# Patient Record
Sex: Male | Born: 1955 | Race: Black or African American | Hispanic: No | Smoking: Never smoker
Health system: Southern US, Community
[De-identification: ages and names within clinical notes are randomized; demographics above are authoritative.]

## PROBLEM LIST (undated history)

## (undated) HISTORY — PX: NO PAST SURGERIES: SHX2092

---

## 2012-01-03 DIAGNOSIS — J309 Allergic rhinitis, unspecified: Secondary | ICD-10-CM | POA: Insufficient documentation

## 2012-01-03 DIAGNOSIS — Z0000 Encounter for general adult medical examination without abnormal findings: Secondary | ICD-10-CM | POA: Insufficient documentation

## 2013-02-28 DIAGNOSIS — Z86010 Personal history of colonic polyps: Secondary | ICD-10-CM | POA: Insufficient documentation

## 2013-12-05 DIAGNOSIS — G4733 Obstructive sleep apnea (adult) (pediatric): Secondary | ICD-10-CM | POA: Insufficient documentation

## 2018-07-20 ENCOUNTER — Ambulatory Visit
Admission: EM | Admit: 2018-07-20 | Discharge: 2018-07-20 | Disposition: A | Discharge status: Home / Self Care | Payer: BLUE CROSS/BLUE SHIELD

## 2018-07-20 ENCOUNTER — Encounter: Payer: Self-pay | Admitting: Emergency Medicine

## 2018-07-20 ENCOUNTER — Other Ambulatory Visit: Payer: Self-pay

## 2018-07-20 DIAGNOSIS — J069 Acute upper respiratory infection, unspecified: Secondary | ICD-10-CM | POA: Diagnosis not present

## 2018-07-20 DIAGNOSIS — B9789 Other viral agents as the cause of diseases classified elsewhere: Secondary | ICD-10-CM

## 2018-07-20 DIAGNOSIS — J0110 Acute frontal sinusitis, unspecified: Secondary | ICD-10-CM

## 2018-07-20 MED ORDER — DOXYCYCLINE HYCLATE 100 MG PO CAPS: 100.0000 mg | ORAL_CAPSULE | Freq: Two times a day (BID) | ORAL | 0 refills | Status: DC

## 2018-07-20 NOTE — ED Provider Notes (Signed)
[facility] ____________________________________________  Time seen: Approximately 12:17 PM  I have reviewed the triage vital signs and the nursing notes.   HISTORY  Chief Complaint Cough   HPI Stephen Forbes is a 62 y.o. male presenting for evaluation of 1 week of runny nose, nasal congestion, cough.  States initially had a sore throat but states that since resolved.  Also states that the cough has started to improve but continues still with a lot of nasal congestion and sinus pressure.  States minimal pain currently to sinuses.  No accompanying chest pain or shortness of breath.  Denies known fevers.  States he got sick a little over week ago after visiting family at Thanksgiving.  Continues to eat and drink well.  Did try some over-the-counter NyQuil and congestion medication that help but no resolution.  Denies other aggravating alleviating factors.  Worse otherwise doing well.  Denies other complaints.  Denies recent antibiotic use.  PCP: Graciela HusbandsKlein   History reviewed. No pertinent past medical history. Denies   There are no active problems to display for this patient.   History reviewed. No pertinent surgical history.   No current facility-administered medications for this encounter.   Current Outpatient Medications:  .  aspirin 81 MG chewable tablet, Chew by mouth., Disp: , Rfl:  .  Multiple Vitamins-Minerals (MULTIVITAL) tablet, Take by mouth., Disp: , Rfl:  .  doxycycline (VIBRAMYCIN) 100 MG capsule, Take 1 capsule (100 mg total) by mouth 2 (two) times daily., Disp: 20 capsule, Rfl: 0  Allergies Patient has no known allergies.  History reviewed. No pertinent family history.  Social History Social History   Tobacco Use  . Smoking status: Never Smoker  . Smokeless tobacco: Never Used  Substance Use Topics  . Alcohol use: Never    Frequency: Never  . Drug use: Never    Review of Systems Constitutional: No fever ENT: as above. Cardiovascular: Denies  chest pain. Respiratory: Denies shortness of breath. Gastrointestinal: No abdominal pain. Musculoskeletal: Negative for back pain. Skin: Negative for rash.  ____________________________________________   PHYSICAL EXAM:  VITAL SIGNS: ED Triage Vitals  Enc Vitals Group     BP 07/20/18 1143 (!) 155/100     Pulse Rate 07/20/18 1143 75     Resp 07/20/18 1143 16     Temp 07/20/18 1143 98.4 F (36.9 C)     Temp Source 07/20/18 1143 Oral     SpO2 07/20/18 1143 99 %     Weight 07/20/18 1141 275 lb (124.7 kg)     Height 07/20/18 1141 6' (1.829 m)     Head Circumference --      Peak Flow --      Pain Score 07/20/18 1141 0     Pain Loc --      Pain Edu? --      Excl. in GC? --    Vitals:   07/20/18 1141 07/20/18 1143 07/20/18 1210  BP:  (!) 155/100 138/90  Pulse:  75   Resp:  16   Temp:  98.4 F (36.9 C)   TempSrc:  Oral   SpO2:  99%   Weight: 275 lb (124.7 kg)    Height: 6' (1.829 m)      Constitutional: Alert and oriented. Well appearing and in no acute distress. Eyes: Conjunctivae are normal.  Head: Atraumatic.  Mild bilateral frontal sinus tenderness palpation.  No maxillary sinus tenderness.  No swelling. No erythema.  Ears: no erythema, normal TMs bilaterally.   Nose:Nasal congestion  Mouth/Throat: Mucous membranes are moist. No pharyngeal erythema. No tonsillar swelling or exudate.  Neck: No stridor.  No cervical spine tenderness to palpation. Hematological/Lymphatic/Immunilogical: No cervical lymphadenopathy. Cardiovascular: Normal rate, regular rhythm. Grossly normal heart sounds.  Good peripheral circulation. Respiratory: Normal respiratory effort.  No retractions. No wheezes, rales or rhonchi. Good air movement.  Musculoskeletal: Ambulatory with steady gait.  Neurologic:  Normal speech and language. No gait instability. Skin:  Skin appears warm, dry and intact. No rash noted. Psychiatric: Mood and affect are normal. Speech and behavior are  normal.  ___________________________________________   LABS (all labs ordered are listed, but only abnormal results are displayed)  Labs Reviewed - No data to display   PROCEDURES Procedures    INITIAL IMPRESSION / ASSESSMENT AND PLAN / ED COURSE  Pertinent labs & imaging results that were available during my care of the patient were reviewed by me and considered in my medical decision making (see chart for details).  Well-appearing patient.  No acute distress.  Suspect recent viral upper respiratory infection.  Concern for secondary sinusitis.  Will treat with oral doxycycline.  Encourage rest, fluids, supportive care.Discussed indication, risks and benefits of medications with patient.  Discussed follow up with Primary care physician this week as needed. Discussed follow up and return parameters including no resolution or any worsening concerns. Patient verbalized understanding and agreed to plan.   ____________________________________________   FINAL CLINICAL IMPRESSION(S) / ED DIAGNOSES  Final diagnoses:  Viral URI with cough  Acute frontal sinusitis, recurrence not specified     ED Discharge Orders         Ordered    doxycycline (VIBRAMYCIN) 100 MG capsule  2 times daily     07/20/18 1206           Note: This dictation was prepared with Dragon dictation along with smaller phrase technology. Any transcriptional errors that result from this process are unintentional.         Renford Dills, NP 07/20/18 1220

## 2018-07-20 NOTE — ED Triage Notes (Signed)
Patient c/o cough, congestion and nasal congestion that started a week ago.

## 2018-07-20 NOTE — Discharge Instructions (Addendum)
Take medication as prescribed. Rest. Drink plenty of fluids.  ° °Follow up with your primary care physician this week as needed. Return to Urgent care for new or worsening concerns.  ° °

## 2019-05-10 ENCOUNTER — Other Ambulatory Visit: Payer: Self-pay

## 2019-05-10 ENCOUNTER — Ambulatory Visit
Admission: EM | Admit: 2019-05-10 | Discharge: 2019-05-10 | Disposition: A | Discharge status: Home / Self Care | Payer: BC Managed Care – PPO | Attending: Family Medicine | Admitting: Family Medicine

## 2019-05-10 ENCOUNTER — Encounter: Payer: Self-pay | Admitting: Emergency Medicine

## 2019-05-10 DIAGNOSIS — L03115 Cellulitis of right lower limb: Secondary | ICD-10-CM

## 2019-05-10 MED ORDER — CEPHALEXIN 500 MG PO CAPS: 500.0000 mg | ORAL_CAPSULE | Freq: Four times a day (QID) | ORAL | 0 refills | Status: DC

## 2019-05-10 MED ORDER — CEFAZOLIN SODIUM 1 G IJ SOLR
1.0000 g | Freq: Once | INTRAMUSCULAR | Status: AC
  Administered 2019-05-10: 14:00:00 1 g via INTRAMUSCULAR

## 2019-05-10 NOTE — ED Triage Notes (Signed)
Patient states that he fell and twisted his right knee a week ago.  Patient states that the pain has gone away but still has swelling in his right and in his right lower leg.

## 2019-05-10 NOTE — Discharge Instructions (Signed)
Warm compresses to area and elevate leg as much as possible Follow up with Primary Care Provider next week as scheduled Go to Emergency Department if symptoms are worsening

## 2019-05-10 NOTE — ED Provider Notes (Signed)
[facility]    CSN: [id number] Arrival date & time: 05/10/19  1318      History   Chief Complaint Chief Complaint  Patient presents with  . Fall  . knee swelling    right    HPI Stephen Forbes is a 63 y.o. male.   64 yo male with a c/o right knee and lower leg swelling for the past week after twisting, falling and scraping his knee. States he didn't fall hard on the knee but did scrape the skin. Has been able to walk/bear weight. Denies any fevers or drainage.   Fall    History reviewed. No pertinent past medical history.  There are no active problems to display for this patient.   History reviewed. No pertinent surgical history.     Home Medications    Prior to Admission medications   Medication Sig Start Date End Date Taking? Authorizing Provider  aspirin 81 MG chewable tablet Chew by mouth.   Yes [provider]  Cholecalciferol (VITAMIN D-1000 MAX ST) 25 MCG (1000 UT) tablet Take by mouth.   Yes [provider]  Multiple Vitamins-Minerals (MULTIVITAL) tablet Take by mouth.   Yes [provider]  cephALEXin (KEFLEX) 500 MG capsule Take 1 capsule (500 mg total) by mouth 4 (four) times daily. 05/10/19   Payton Mccallum, MD  doxycycline (VIBRAMYCIN) 100 MG capsule Take 1 capsule (100 mg total) by mouth 2 (two) times daily. 07/20/18   Renford Dills, NP    Family History History reviewed. No pertinent family history.  Social History Social History   Tobacco Use  . Smoking status: Never Smoker  . Smokeless tobacco: Never Used  Substance Use Topics  . Alcohol use: Never    Frequency: Never  . Drug use: Never     Allergies   Patient has no known allergies.   Review of Systems Review of Systems   Physical Exam Triage Vital Signs ED Triage Vitals  Enc Vitals Group     BP 05/10/19 1333 (!) 138/94     Pulse Rate 05/10/19 1333 66     Resp 05/10/19 1333 16     Temp 05/10/19 1333 98.2 F (36.8 C)     Temp  Source 05/10/19 1333 Oral     SpO2 05/10/19 1333 100 %     Weight 05/10/19 1330 285 lb (129.3 kg)     Height 05/10/19 1330 6' (1.829 m)     Head Circumference --      Peak Flow --      Pain Score 05/10/19 1330 0     Pain Loc --      Pain Edu? --      Excl. in GC? --    No data found.  Updated Vital Signs BP (!) 138/94 (BP Location: Left Arm)   Pulse 66   Temp 98.2 F (36.8 C) (Oral)   Resp 16   Ht 6' (1.829 m)   Wt 129.3 kg   SpO2 100%   BMI 38.65 kg/m   Visual Acuity Right Eye Distance:   Left Eye Distance:   Bilateral Distance:    Right Eye Near:   Left Eye Near:    Bilateral Near:     Physical Exam Vitals signs and nursing note reviewed.  Constitutional:      General: He is not in acute distress.    Appearance: He is not toxic-appearing or diaphoretic.  Musculoskeletal:     Right lower leg: Edema (right knee and  shin with edema, blanchable erythema, and warmth; extremity neurovascularly intact) present.  Neurological:     Mental Status: He is alert.      UC Treatments / Results  Labs (all labs ordered are listed, but only abnormal results are displayed) Labs Reviewed - No data to display  EKG   Radiology No results found.  Procedures Procedures (including critical care time)  Medications Ordered in UC Medications  ceFAZolin (ANCEF) injection 1 g (1 g Intramuscular Given 05/10/19 1404)    Initial Impression / Assessment and Plan / UC Course  I have reviewed the triage vital signs and the nursing notes.  Pertinent labs & imaging results that were available during my care of the patient were reviewed by me and considered in my medical decision making (see chart for details).      Final Clinical Impressions(s) / UC Diagnoses   Final diagnoses:  Cellulitis of leg, right     Discharge Instructions     Warm compresses to area and elevate leg as much as possible Follow up with Primary Care Provider next week as scheduled Go to Emergency  Department if symptoms are worsening    ED Prescriptions    Medication Sig Dispense Auth. Provider   cephALEXin (KEFLEX) 500 MG capsule Take 1 capsule (500 mg total) by mouth 4 (four) times daily. 14 capsule Norval Gable, MD      1. diagnosis reviewed with patient 2. Given Ancef 1gm IM x 1 3. rx as per orders above; reviewed possible side effects, interactions, risks and benefits  4. Recommend supportive treatment as above 5. Follow-up prn  PDMP not reviewed this encounter.   Norval Gable, MD 05/10/19 [phone]

## 2019-12-06 ENCOUNTER — Ambulatory Visit
Admission: EM | Admit: 2019-12-06 | Discharge: 2019-12-06 | Disposition: A | Discharge status: Home / Self Care | Payer: BC Managed Care – PPO | Attending: Family Medicine | Admitting: Family Medicine

## 2019-12-06 ENCOUNTER — Encounter: Payer: Self-pay | Admitting: Emergency Medicine

## 2019-12-06 ENCOUNTER — Other Ambulatory Visit: Payer: Self-pay

## 2019-12-06 DIAGNOSIS — J0100 Acute maxillary sinusitis, unspecified: Secondary | ICD-10-CM | POA: Diagnosis not present

## 2019-12-06 DIAGNOSIS — Z7982 Long term (current) use of aspirin: Secondary | ICD-10-CM | POA: Diagnosis not present

## 2019-12-06 DIAGNOSIS — J40 Bronchitis, not specified as acute or chronic: Secondary | ICD-10-CM | POA: Diagnosis not present

## 2019-12-06 DIAGNOSIS — R05 Cough: Secondary | ICD-10-CM | POA: Diagnosis present

## 2019-12-06 DIAGNOSIS — Z20822 Contact with and (suspected) exposure to covid-19: Secondary | ICD-10-CM | POA: Diagnosis not present

## 2019-12-06 LAB — SARS CORONAVIRUS 2 (TAT 6-24 HRS): SARS Coronavirus 2: NEGATIVE

## 2019-12-06 MED ORDER — AZITHROMYCIN 250 MG PO TABS: ORAL_TABLET | ORAL | 0 refills | Status: DC

## 2019-12-06 NOTE — ED Triage Notes (Signed)
Patient states that he first was stuffy and runny nose.  Patient states that he had a cough and congestion that started since Monday.  Patient denies fevers.

## 2019-12-06 NOTE — ED Provider Notes (Signed)
[facility]    CSN: [id number] Arrival date & time: 12/06/19  1227      History   Chief Complaint Chief Complaint  Patient presents with  . Cough    HPI Stephen Forbes is a 64 y.o. male.   64 yo male with a c/o nasal congestion and cough for the past 7 days. Also c/o sinus pressure, headache. Cough is productive. Denies any fevers, chills, chest pains, or shortness of breath.    Cough   History reviewed. No pertinent past medical history.  There are no problems to display for this patient.   History reviewed. No pertinent surgical history.     Home Medications    Prior to Admission medications   Medication Sig Start Date End Date Taking? Authorizing Provider  aspirin 81 MG chewable tablet Chew by mouth.   Yes [provider]  Cholecalciferol (VITAMIN D-1000 MAX ST) 25 MCG (1000 UT) tablet Take by mouth.   Yes [provider]  Multiple Vitamins-Minerals (MULTIVITAL) tablet Take by mouth.   Yes [provider]  azithromycin (ZITHROMAX Z-PAK) 250 MG tablet Take 2 tabs po once day 1, then 1 tab po qd for next 4 days 12/06/19   Norval Gable, MD  cephALEXin (KEFLEX) 500 MG capsule Take 1 capsule (500 mg total) by mouth 4 (four) times daily. 05/10/19   Norval Gable, MD  doxycycline (VIBRAMYCIN) 100 MG capsule Take 1 capsule (100 mg total) by mouth 2 (two) times daily. 07/20/18   Marylene Land, NP    Family History History reviewed. No pertinent family history.  Social History Social History   Tobacco Use  . Smoking status: Never Smoker  . Smokeless tobacco: Never Used  Substance Use Topics  . Alcohol use: Never  . Drug use: Never     Allergies   Patient has no known allergies.   Review of Systems Review of Systems  Respiratory: Positive for cough.      Physical Exam Triage Vital Signs ED Triage Vitals  Enc Vitals Group     BP 12/06/19 1242 (!) 153/104     Pulse Rate 12/06/19 1242 69     Resp 12/06/19 1242  16     Temp 12/06/19 1242 98.5 F (36.9 C)     Temp Source 12/06/19 1242 Oral     SpO2 12/06/19 1242 99 %     Weight 12/06/19 1237 285 lb (129.3 kg)     Height 12/06/19 1237 6' (1.829 m)     Head Circumference --      Peak Flow --      Pain Score 12/06/19 1237 0     Pain Loc --      Pain Edu? --      Excl. in [place]? --    No data found.  Updated Vital Signs BP (!) 153/104 (BP Location: Left Arm)   Pulse 69   Temp 98.5 F (36.9 C) (Oral)   Resp 16   Ht 6' (1.829 m)   Wt 129.3 kg   SpO2 99%   BMI 38.65 kg/m   Visual Acuity Right Eye Distance:   Left Eye Distance:   Bilateral Distance:    Right Eye Near:   Left Eye Near:    Bilateral Near:     Physical Exam Vitals and nursing note reviewed.  Constitutional:      General: He is not in acute distress.    Appearance: He is not toxic-appearing or diaphoretic.  HENT:  Right Ear: Tympanic membrane normal.     Left Ear: Tympanic membrane normal.  Cardiovascular:     Rate and Rhythm: Normal rate.     Heart sounds: Normal heart sounds.  Pulmonary:     Effort: Pulmonary effort is normal. No respiratory distress.     Breath sounds: No stridor. Wheezing (few) and rhonchi (few) present. No rales.  Neurological:     Mental Status: He is alert.      UC Treatments / Results  Labs (all labs ordered are listed, but only abnormal results are displayed) Labs Reviewed  SARS CORONAVIRUS 2 (TAT 6-24 HRS)    EKG   Radiology No results found.  Procedures Procedures (including critical care time)  Medications Ordered in UC Medications - No data to display  Initial Impression / Assessment and Plan / UC Course  I have reviewed the triage vital signs and the nursing notes.  Pertinent labs & imaging results that were available during my care of the patient were reviewed by me and considered in my medical decision making (see chart for details).      Final Clinical Impressions(s) / UC Diagnoses   Final diagnoses:    Bronchitis  Acute maxillary sinusitis, recurrence not specified     Discharge Instructions     Continue flonase, over the counter medication as needed    ED Prescriptions    Medication Sig Dispense Auth. Provider   azithromycin (ZITHROMAX Z-PAK) 250 MG tablet Take 2 tabs po once day 1, then 1 tab po qd for next 4 days 6 each Payton Mccallum, MD     1. diagnosis reviewed with patient 2. rx as per orders above; reviewed possible side effects, interactions, risks and benefits  3. Recommend supportive treatment as above 4. Follow-up prn if symptoms worsen or don't improve   PDMP not reviewed this encounter.   Payton Mccallum, MD 12/06/19 1308

## 2019-12-06 NOTE — Discharge Instructions (Signed)
Continue flonase, over the counter medication as needed

## 2020-05-23 ENCOUNTER — Ambulatory Visit
Admission: RE | Admit: 2020-05-23 | Discharge: 2020-05-23 | Disposition: A | Discharge status: Home / Self Care | Payer: BC Managed Care – PPO | Source: Ambulatory Visit | Attending: Family Medicine | Admitting: Family Medicine

## 2020-05-23 ENCOUNTER — Other Ambulatory Visit: Payer: Self-pay

## 2020-05-23 ENCOUNTER — Ambulatory Visit
Admission: EM | Admit: 2020-05-23 | Discharge: 2020-05-23 | Disposition: A | Discharge status: Home / Self Care | Payer: BC Managed Care – PPO | Attending: Family Medicine | Admitting: Family Medicine

## 2020-05-23 DIAGNOSIS — M7989 Other specified soft tissue disorders: Secondary | ICD-10-CM | POA: Diagnosis not present

## 2020-05-23 MED ORDER — SULFAMETHOXAZOLE-TRIMETHOPRIM 800-160 MG PO TABS: 1.0000 | ORAL_TABLET | Freq: Two times a day (BID) | ORAL | 0 refills | Status: AC

## 2020-05-23 NOTE — Discharge Instructions (Signed)
I will call with the results.  Go straight to [facility].  Take care  Dr. Adriana Simas

## 2020-05-23 NOTE — ED Triage Notes (Signed)
Patient complains of left leg swelling, pain and redness that started yesterday. States that he had this once before and it was infected.

## 2020-05-23 NOTE — ED Provider Notes (Signed)
[facility]    CSN: [id number] Arrival date & time: 05/23/20  0840      History   Chief Complaint Chief Complaint  Patient presents with  . Leg Pain   HPI  64 year old male with a history of cellulitis presents with left leg swelling, redness.  Started yesterday.  Patient noted that his left lower leg was swollen.  More edema than his baseline.  Associated redness and warmth.  He states it is not particularly painful.  States that he has no pain at this time.  Patient is a [employer].  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.  Most recent long trip was this past Thursday.  He states that he will be traveling today as well.  Patient is concerned that he may have cellulitis as this is similar to his prior presentation.  However, previously he had break in the skin.  No fall, trauma, injury.  No known breaks in the skin/open wounds.  No shortness of breath.  No other complaints at this time.  Home Medications    Prior to Admission medications   Medication Sig Start Date End Date Taking? Authorizing Provider  aspirin 81 MG chewable tablet Chew by mouth.   Yes [provider]  Cholecalciferol (VITAMIN D-1000 MAX ST) 25 MCG (1000 UT) tablet Take by mouth.   Yes [provider]  sulfamethoxazole-trimethoprim (BACTRIM DS) 800-160 MG tablet Take 1 tablet by mouth 2 (two) times daily for 10 days. 05/23/20 06/02/20  Tommie Sams, DO   Social History Social History   Tobacco Use  . Smoking status: Never Smoker  . Smokeless tobacco: Never Used  Vaping Use  . Vaping Use: Never used  Substance Use Topics  . Alcohol use: Never  . Drug use: Never     Allergies   Patient has no known allergies.   Review of Systems Review of Systems  Respiratory: Negative.   Musculoskeletal:       Left lower leg warmth, redness, swelling.    Physical Exam Triage Vital Signs ED Triage Vitals  Enc Vitals Group     BP 05/23/20 0853 (!) 147/90     Pulse Rate  05/23/20 0853 66     Resp 05/23/20 0853 18     Temp 05/23/20 0853 98.4 F (36.9 C)     Temp Source 05/23/20 0853 Oral     SpO2 05/23/20 0853 98 %     Weight 05/23/20 0851 290 lb (131.5 kg)     Height 05/23/20 0851 6' (1.829 m)     Head Circumference --      Peak Flow --      Pain Score 05/23/20 0851 0     Pain Loc --      Pain Edu? --      Excl. in GC? --     Updated Vital Signs BP (!) 147/90 (BP Location: Right Arm)   Pulse 66   Temp 98.4 F (36.9 C) (Oral)   Resp 18   Ht 6' (1.829 m)   Wt 131.5 kg   SpO2 98%   BMI 39.33 kg/m   Visual Acuity Right Eye Distance:   Left Eye Distance:   Bilateral Distance:    Right Eye Near:   Left Eye Near:    Bilateral Near:     Physical Exam Constitutional:      Appearance: Normal appearance.  HENT:     Head: Normocephalic and atraumatic.  Eyes:     General:  Right eye: No discharge.        Left eye: No discharge.     Conjunctiva/sclera: Conjunctivae normal.  Cardiovascular:     Rate and Rhythm: Normal rate and regular rhythm.  Pulmonary:     Effort: Pulmonary effort is normal. No respiratory distress.     Breath sounds: Normal breath sounds.  Musculoskeletal:     Comments: Left lower extremity -1+ pitting edema noted.  Mild erythema on the anterior aspect of the lower leg.  Mild warmth.  Neurological:     Mental Status: He is alert.  Psychiatric:        Mood and Affect: Mood normal.        Behavior: Behavior normal.    UC Treatments / Results  Labs (all labs ordered are listed, but only abnormal results are displayed) Labs Reviewed - No data to display  EKG   Radiology US Venous Img Lower Unilateral Left (DVT)  Result Date: 05/23/2020 CLINICAL DATA:  Swelling x1 week EXAM: LEFT LOWER EXTREMITY VENOUS DOPPLER ULTRASOUND TECHNIQUE: Gray-scale sonography with compression, as well as color and duplex ultrasound, were performed to evaluate the deep venous system(s) from the level of the common femoral vein  through the popliteal and proximal calf veins. COMPARISON:  None. FINDINGS: VENOUS Normal compressibility of the common femoral, superficial femoral, and popliteal veins, as well as the visualized calf veins. Visualized portions of profunda femoral vein and great saphenous vein unremarkable. No filling defects to suggest DVT on grayscale or color Doppler imaging. Doppler waveforms show normal direction of venous flow, normal respiratory phasicity and response to augmentation. Limited views of the contralateral common femoral vein are unremarkable. OTHER None. Limitations: none IMPRESSION: No femoropopliteal DVT nor evidence of DVT within the visualized calf veins. If clinical symptoms are inconsistent or if there are persistent or worsening symptoms, further imaging (possibly involving the iliac veins) may be warranted. Electronically Signed   By: Corlis Leak M.D.   On: 05/23/2020 11:05    Procedures Procedures (including critical care time)  Medications Ordered in UC Medications - No data to display  Initial Impression / Assessment and Plan / UC Course  I have reviewed the triage vital signs and the nursing notes.  Pertinent labs & imaging results that were available during my care of the patient were reviewed by me and considered in my medical decision making (see chart for details).    64 year old male presents with left lower extremity swelling, redness.  Ultrasound negative for DVT.  Treating for cellulitis with Bactrim.  Final Clinical Impressions(s) / UC Diagnoses   Final diagnoses:  Left leg swelling     Discharge Instructions     I will call with the results.  Go straight to [facility].  Take care  Dr. Adriana Simas     ED Prescriptions    Medication Sig Dispense Auth. Provider   sulfamethoxazole-trimethoprim (BACTRIM DS) 800-160 MG tablet Take 1 tablet by mouth 2 (two) times daily for 10 days. 20 tablet Tommie Sams, DO     PDMP not reviewed this encounter.     Tommie Sams, [place] 05/23/20 1117

## 2021-12-17 ENCOUNTER — Ambulatory Visit
Admission: EM | Admit: 2021-12-17 | Discharge: 2021-12-17 | Disposition: A | Discharge status: Home / Self Care | Payer: BC Managed Care – PPO

## 2021-12-17 DIAGNOSIS — Z8546 Personal history of malignant neoplasm of prostate: Secondary | ICD-10-CM | POA: Insufficient documentation

## 2021-12-17 DIAGNOSIS — W57XXXA Bitten or stung by nonvenomous insect and other nonvenomous arthropods, initial encounter: Secondary | ICD-10-CM

## 2021-12-17 DIAGNOSIS — L089 Local infection of the skin and subcutaneous tissue, unspecified: Secondary | ICD-10-CM | POA: Diagnosis not present

## 2021-12-17 DIAGNOSIS — E669 Obesity, unspecified: Secondary | ICD-10-CM | POA: Insufficient documentation

## 2021-12-17 DIAGNOSIS — E559 Vitamin D deficiency, unspecified: Secondary | ICD-10-CM | POA: Insufficient documentation

## 2021-12-17 MED ORDER — DOXYCYCLINE HYCLATE 100 MG PO CAPS: 100.0000 mg | ORAL_CAPSULE | Freq: Two times a day (BID) | ORAL | 0 refills | Status: AC

## 2021-12-17 NOTE — ED Provider Notes (Signed)
?[facility] ? ? ? ?CSN: [id number] ?Arrival date & time: 12/17/21  0911 ? ? ?  ? ?History   ?Chief Complaint ?Chief Complaint  ?Patient presents with  ? Skin Problem  ? Tick Removal  ?  Tick Bite  ? ? ?HPI ?Joshual Terrio is a 66 y.o. male presenting for tick bite check. History noncontributory. Patient states he noticed a tick on his left upper buttock x7 days, he removed this but wasn't sure if he got the head. He is feeling well, without myalgias, fevers, arthralgias, myalgias, rashes/lesion. ? ?HPI ? ?History reviewed. No pertinent past medical history. ? ?Patient Active Problem List  ? Diagnosis Date Noted  ? Vitamin D deficiency 12/17/2021  ? Obesity 12/17/2021  ? H/O prostate cancer 12/17/2021  ? OSA (obstructive sleep apnea) 12/05/2013  ? History of adenomatous polyp of colon 02/28/2013  ? Routine history and physical examination of adult 01/03/2012  ? Allergic rhinitis due to allergen 01/03/2012  ? ? ?Past Surgical History:  ?Procedure Laterality Date  ? NO PAST SURGERIES    ? ? ? ? ? ?Home Medications   ? ?Prior to Admission medications   ?Medication Sig Start Date End Date Taking? Authorizing Provider  ?aspirin 81 MG chewable tablet Chew by mouth.   Yes [provider]  ?Cholecalciferol 50 MCG (2000 UT) CAPS Take by mouth. 11/24/09  Yes [provider]  ?doxycycline (VIBRAMYCIN) 100 MG capsule Take 1 capsule (100 mg total) by mouth 2 (two) times daily for 1 day. 12/17/21 12/18/21 Yes Rhys Martini, PA-C  ?Magnesium Gluconate 550 MG TABS Take by mouth.   Yes [provider]  ?zinc gluconate 50 MG tablet Take by mouth.   Yes [provider]  ? ? ?Family History ?No family history on file. ? ?Social History ?Social History  ? ?Tobacco Use  ? Smoking status: Never  ? Smokeless tobacco: Never  ?Vaping Use  ? Vaping Use: Never used  ?Substance Use Topics  ? Alcohol use: Never  ? Drug use: Never  ? ? ? ?Allergies   ?Patient has no known allergies. ? ? ?Review of  Systems ?Review of Systems  ?Skin:  Positive for color change.  ?All other systems reviewed and are negative. ? ? ?Physical Exam ?Triage Vital Signs ?ED Triage Vitals  ?Enc Vitals Group  ?   BP 12/17/21 0920 (!) 150/95  ?   Pulse Rate 12/17/21 0920 67  ?   Resp 12/17/21 0918 20  ?   Temp 12/17/21 0918 98.5 ?F (36.9 ?C)  ?   Temp Source 12/17/21 0918 Oral  ?   SpO2 12/17/21 0920 97 %  ?   Weight 12/17/21 0917 295 lb (133.8 kg)  ?   Height 12/17/21 0917 6' (1.829 m)  ?   Head Circumference --   ?   Peak Flow --   ?   Pain Score 12/17/21 0917 0  ?   Pain Loc --   ?   Pain Edu? --   ?   Excl. in GC? --   ? ?No data found. ? ?Updated Vital Signs ?BP (!) 150/95 (BP Location: Left Arm)   Pulse 67   Temp 98.5 ?F (36.9 ?C) (Oral)   Resp 20   Ht 6' (1.829 m)   Wt 295 lb (133.8 kg)   SpO2 97%   BMI 40.01 kg/m?  ? ?Visual Acuity ?Right Eye Distance:   ?Left Eye Distance:   ?Bilateral Distance:   ? ?Right  Eye Near:   ?Left Eye Near:    ?Bilateral Near:    ? ?Physical Exam ?Vitals reviewed.  ?Constitutional:   ?   General: He is not in acute distress. ?   Appearance: Normal appearance. He is not ill-appearing.  ?HENT:  ?   Head: Normocephalic and atraumatic.  ?Pulmonary:  ?   Effort: Pulmonary effort is normal.  ?Skin: ?   Comments: L upper buttock with small 1cm area of mild erythema and inflammation consistent with insect bite. No surrounding warmth, erythema, induration, fluctuance, drainage.  ?Neurological:  ?   General: No focal deficit present.  ?   Mental Status: He is alert and oriented to person, place, and time.  ?Psychiatric:     ?   Mood and Affect: Mood normal.     ?   Behavior: Behavior normal.     ?   Thought Content: Thought content normal.     ?   Judgment: Judgment normal.  ? ? ? ?UC Treatments / Results  ?Labs ?(all labs ordered are listed, but only abnormal results are displayed) ?Labs Reviewed - No data to display ? ?EKG ? ? ?Radiology ?No results found. ? ?Procedures ?Procedures (including critical  care time) ? ?Medications Ordered in UC ?Medications - No data to display ? ?Initial Impression / Assessment and Plan / UC Course  ?I have reviewed the triage vital signs and the nursing notes. ? ?Pertinent labs & imaging results that were available during my care of the patient were reviewed by me and considered in my medical decision making (see chart for details). ? ?  ? ?This patient is a very pleasant 66 y.o. year old male presenting following tick bite.  Patient removed the tick himself 1 week ago, I do not see any residual head remaining.  He does not have any bull's-eye rashes or flulike symptoms.  Will manage with doxycycline 200 mg.  Follow-up if new symptoms, discussed signs and symptoms of Lyme disease..  ? ?Final Clinical Impressions(s) / UC Diagnoses  ? ?Final diagnoses:  ?Skin inflammation  ?Tick bite, unspecified site, initial encounter  ? ? ? ?Discharge Instructions   ? ?  ?-Doxycycline, one pill in the morning and one pill in the evening ? ? ?ED Prescriptions   ? ? Medication Sig Dispense Auth. Provider  ? doxycycline (VIBRAMYCIN) 100 MG capsule Take 1 capsule (100 mg total) by mouth 2 (two) times daily for 1 day. 2 capsule Rhys Martini, PA-C  ? ?  ? ?PDMP not reviewed this encounter. ?  ?Rhys Martini, PA-C ?12/17/21 1050 ? ?

## 2021-12-17 NOTE — Discharge Instructions (Addendum)
-  Doxycycline, one pill in the morning and one pill in the evening ?

## 2021-12-17 NOTE — ED Triage Notes (Signed)
Patient is here for ""tick on hip". Pulled it out "maybe left head in". "I feel a bump there now". No fever. Noticed it first "last Saturday".  ?

## 2022-05-16 IMAGING — US US EXTREM LOW VENOUS*L*
1 series · 14 of 24 positions shown · non-contrast
Comparison: None.

CLINICAL DATA: Swelling x1 week

EXAM:
LEFT LOWER EXTREMITY VENOUS DOPPLER ULTRASOUND
TECHNIQUE: Gray-scale sonography with compression, as well as color and duplex
ultrasound, were performed to evaluate the deep venous system(s)
from the level of the common femoral vein through the popliteal and
proximal calf veins.

[Series 1: us venous img lower uni left (dvt) · portal-venous · 14 of 37 slices shown]
[im 1/37]
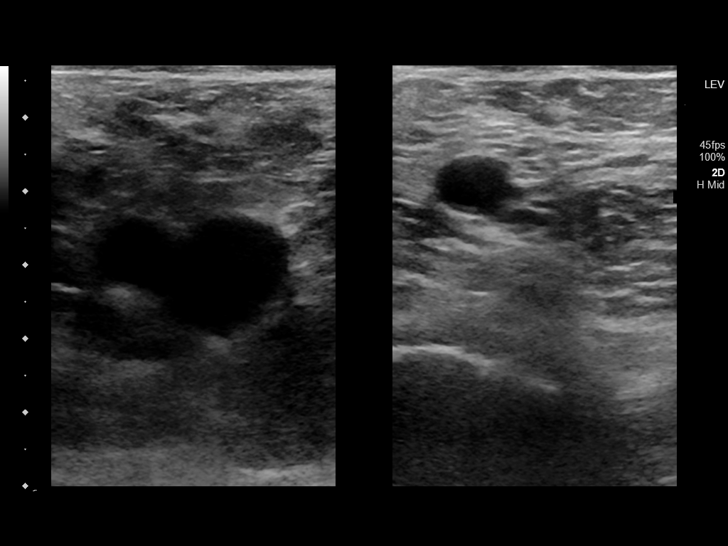
[im 4/37]
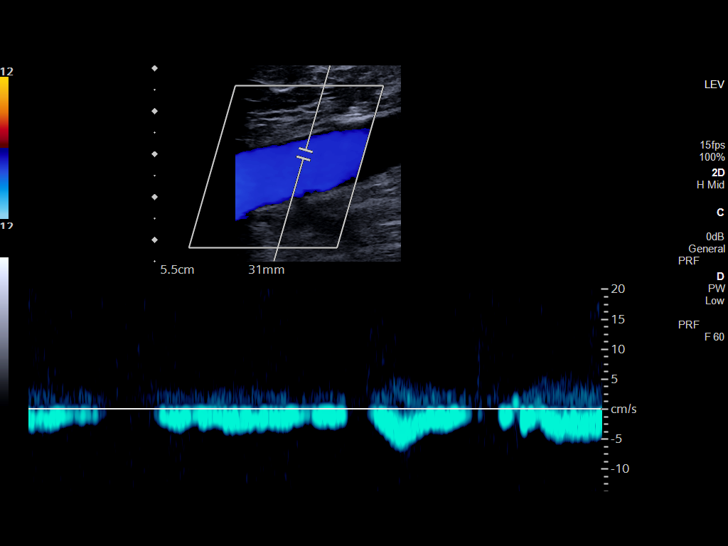
[im 7/37]
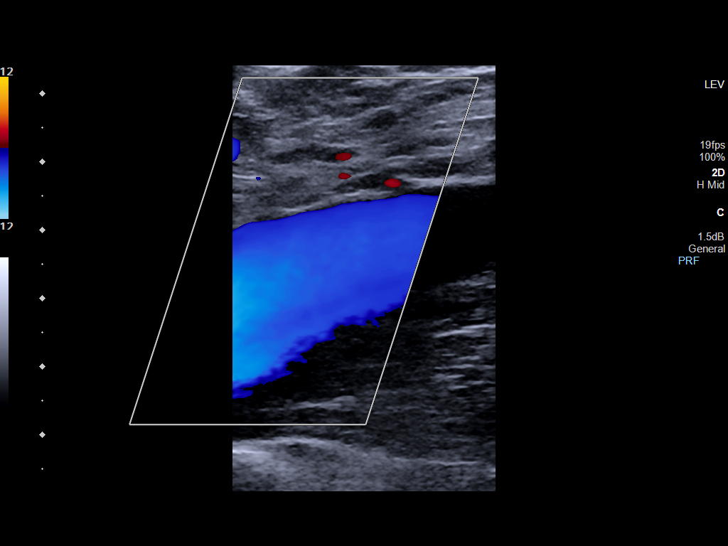
[im 10/37]
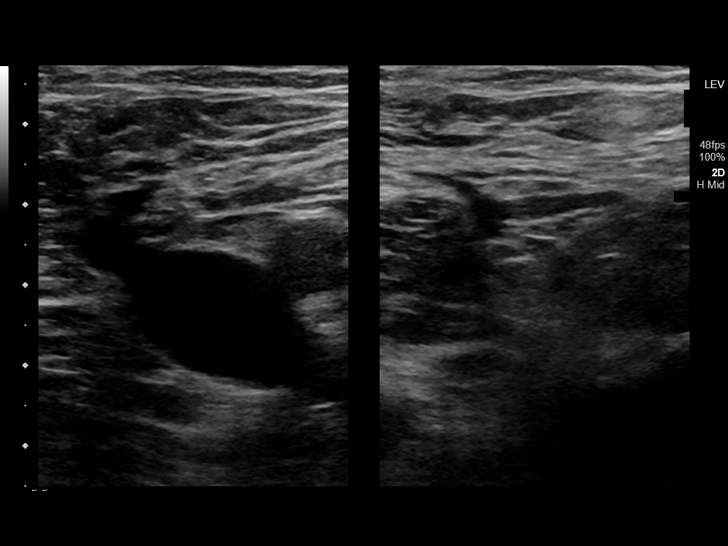
[im 11/37]
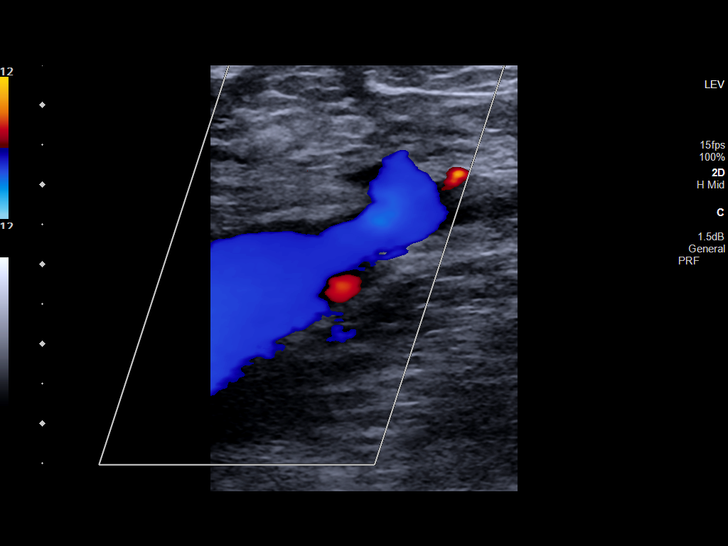
[im 15/37]
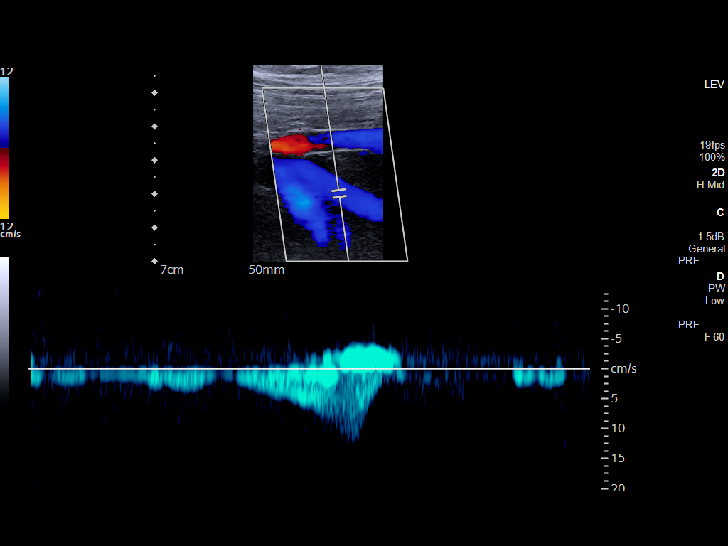
[im 18/37]
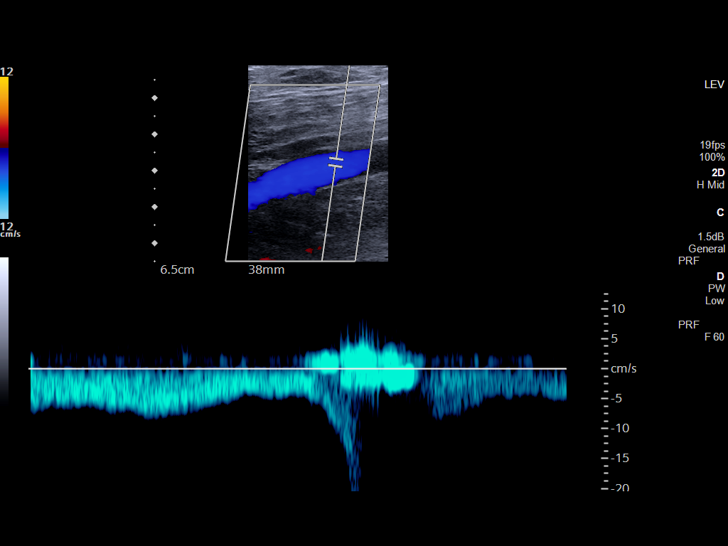
[im 19/37]
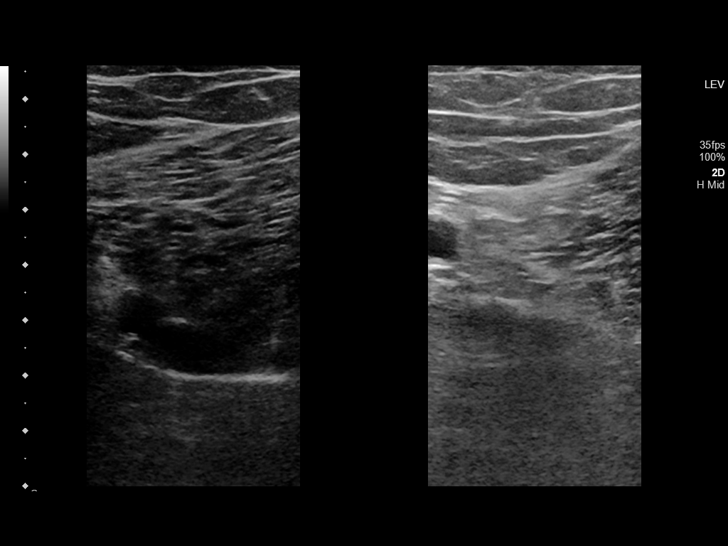
[im 22/37]
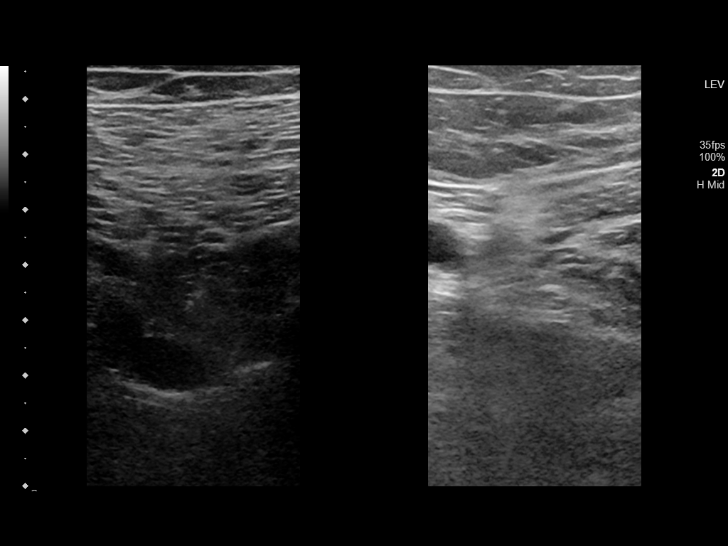
[im 26/37]
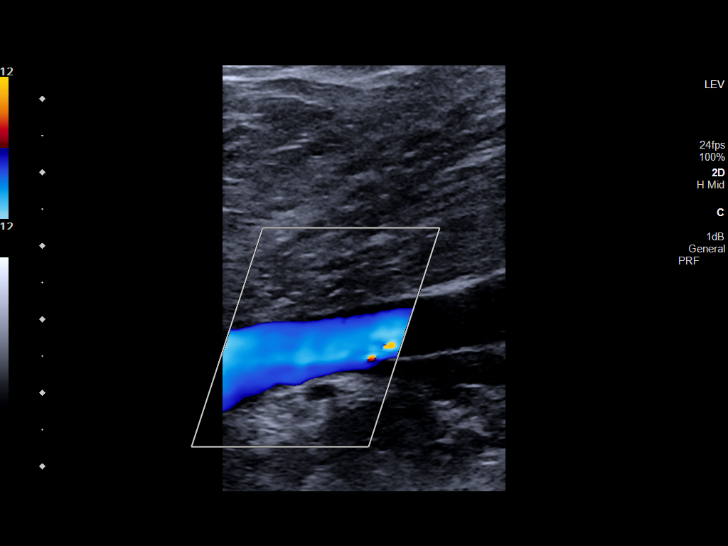
[im 29/37]
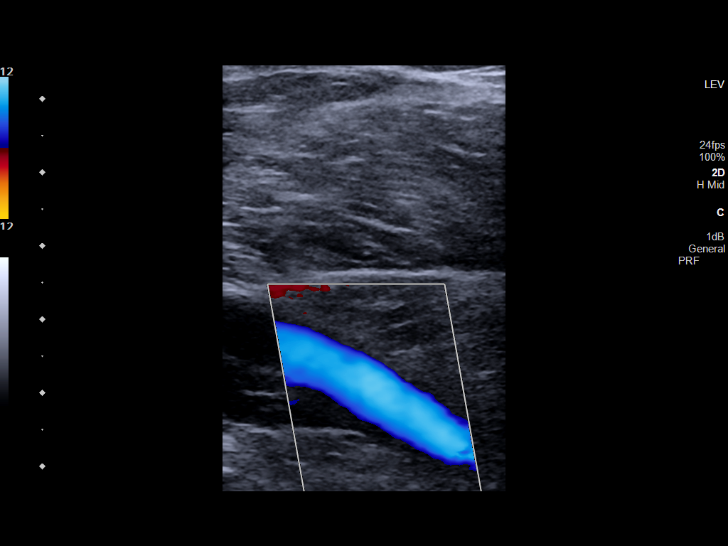
[im 30/37]
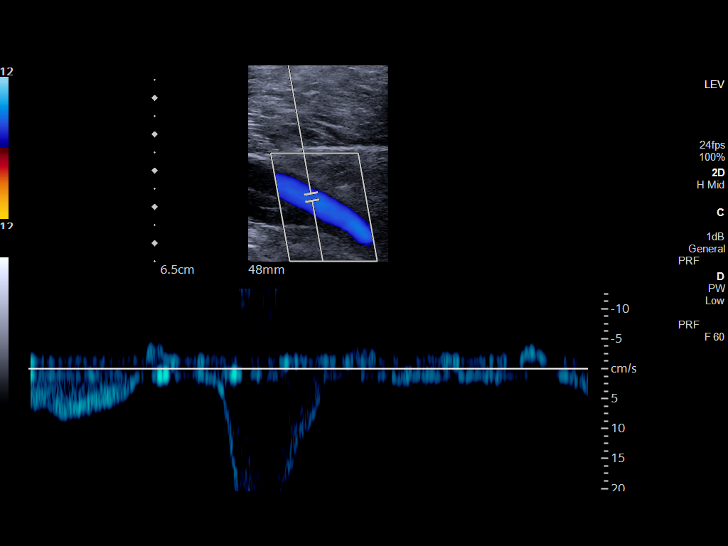
[im 33/37]
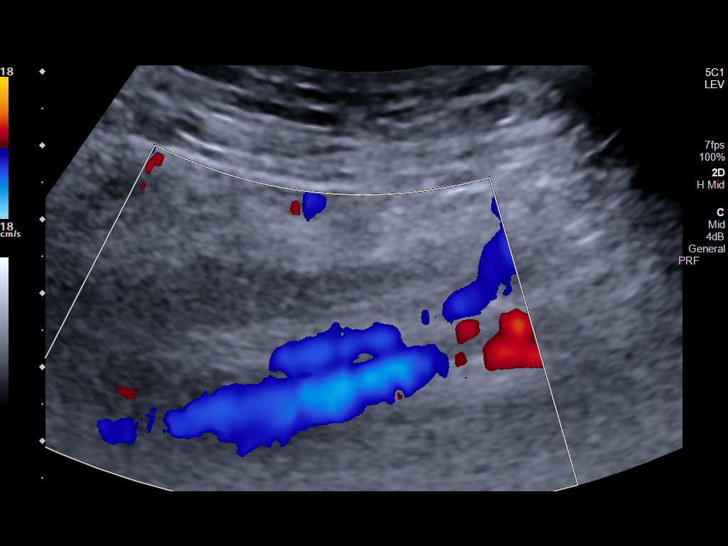
[im 37/37]
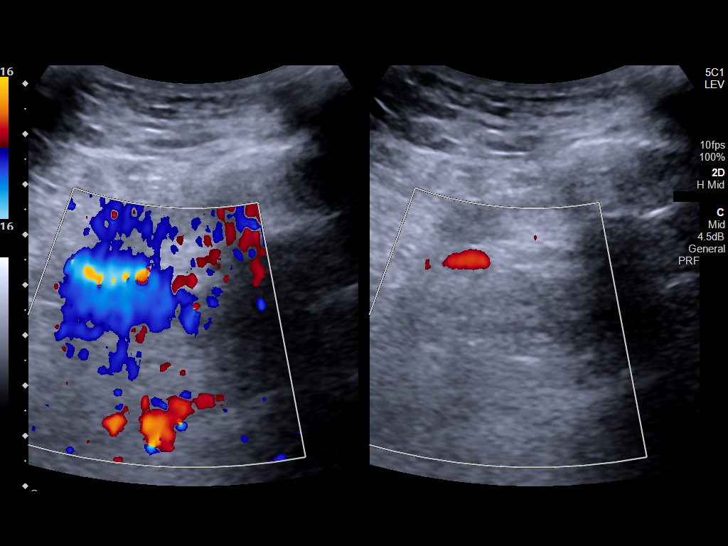

[14 of 24 positions shown; findings below may reference images not displayed]

FINDINGS: VENOUS

Normal compressibility of the common femoral, superficial femoral,
and popliteal veins, as well as the visualized calf veins.
Visualized portions of profunda femoral vein and great saphenous
vein unremarkable. No filling defects to suggest DVT on grayscale or
color Doppler imaging. Doppler waveforms show normal direction of
venous flow, normal respiratory phasicity and response to
augmentation.

Limited views of the contralateral common femoral vein are
unremarkable.

OTHER

None.

Limitations: none
IMPRESSION: No femoropopliteal DVT nor evidence of DVT within the visualized
calf veins.

If clinical symptoms are inconsistent or if there are persistent or
worsening symptoms, further imaging (possibly involving the iliac
veins) may be warranted.

## 2023-01-28 ENCOUNTER — Ambulatory Visit
Admission: EM | Admit: 2023-01-28 | Discharge: 2023-01-28 | Disposition: A | Discharge status: Home / Self Care | Payer: BC Managed Care – PPO | Attending: Family | Admitting: Family

## 2023-01-28 DIAGNOSIS — J069 Acute upper respiratory infection, unspecified: Secondary | ICD-10-CM

## 2023-01-28 MED ORDER — BENZONATATE 100 MG PO CAPS: 200.0000 mg | ORAL_CAPSULE | Freq: Three times a day (TID) | ORAL | 0 refills | Status: AC | PRN

## 2023-01-28 MED ORDER — IPRATROPIUM BROMIDE 0.06 % NA SOLN: 2.0000 | Freq: Four times a day (QID) | NASAL | 12 refills | Status: AC

## 2023-01-28 MED ORDER — PROMETHAZINE-DM 6.25-15 MG/5ML PO SYRP: 5.0000 mL | ORAL_SOLUTION | Freq: Four times a day (QID) | ORAL | 0 refills | Status: AC | PRN

## 2023-01-28 NOTE — Discharge Instructions (Signed)
1) Place 2 sprays into both nostrils 4 times daily for your symptoms 2) take Tessalon Perles 100 mg 1 tablet by mouth 3 times a day as needed for cough. 3) take 5 mg by mouth 4 times daily as needed for up to 7 days for cough 4) report new or worsening symptoms to local emergency room or your family doctor.

## 2023-01-28 NOTE — ED Triage Notes (Signed)
Sx started Tuesday, Scratchy throat- sore throat, chest and nasal congestion, headache. He's been taking mucinex with no relief.

## 2023-01-28 NOTE — ED Provider Notes (Signed)
[facility]    CSN: [id number] Arrival date & time: 01/28/23  4098      History   Chief Complaint No chief complaint on file.   HPI Stephen Forbes is a 67 y.o. male presented to the urgent care this morning with the 3 to 4-day history of nasal congestion and wheezing when he lays back.  She has a history of obesity, vitamin D deficiency, sleep apnea, and he was seen before for acute sinusitis and cough in 2021 at this facility.  Patient denies fever, chills, shortness of breath, chest pain, contact with the people sick with COVID, runny nose or sore throat.  He hasn't been taking any medication for his symptoms.  History reviewed. No pertinent past medical history.  Patient Active Problem List   Diagnosis Date Noted   Vitamin D deficiency 12/17/2021   Obesity 12/17/2021   H/O prostate cancer 12/17/2021   OSA (obstructive sleep apnea) 12/05/2013   History of adenomatous polyp of colon 02/28/2013   Routine history and physical examination of adult 01/03/2012   Allergic rhinitis due to allergen 01/03/2012    Past Surgical History:  Procedure Laterality Date   NO PAST SURGERIES         Home Medications    Prior to Admission medications   Medication Sig Start Date End Date Taking? Authorizing Provider  aspirin 81 MG chewable tablet Chew by mouth.   Yes [provider]  benzonatate (TESSALON PERLES) 100 MG capsule Take 2 capsules (200 mg total) by mouth 3 (three) times daily as needed for up to 7 days for cough. 01/28/23 02/04/23 Yes Eleonore Chiquito, FNP  Cholecalciferol 50 MCG (2000 UT) CAPS Take by mouth. 11/24/09  Yes [provider]  ipratropium (ATROVENT) 0.06 % nasal spray Place 2 sprays into both nostrils 4 (four) times daily. 01/28/23  Yes Eleonore Chiquito, FNP  Magnesium Gluconate 550 MG TABS Take by mouth.   Yes [provider]  promethazine-dextromethorphan (PROMETHAZINE-DM) 6.25-15 MG/5ML syrup Take 5 mLs by mouth 4 (four) times  daily as needed for up to 7 days for cough. 01/28/23 02/04/23 Yes Eleonore Chiquito, FNP  zinc gluconate 50 MG tablet Take by mouth.   Yes [provider]    Family History History reviewed. No pertinent family history.  Social History Social History   Tobacco Use   Smoking status: Never   Smokeless tobacco: Never  Vaping Use   Vaping Use: Never used  Substance Use Topics   Alcohol use: Never   Drug use: Never     Allergies   Patient has no known allergies.   Review of Systems Review of Systems  Constitutional: Negative.   HENT:  Positive for congestion and rhinorrhea.   Eyes: Negative.   Respiratory:  Positive for cough.   Cardiovascular: Negative.   Gastrointestinal: Negative.   Endocrine: Negative.   Allergic/Immunologic: Negative.      Physical Exam Triage Vital Signs ED Triage Vitals  Enc Vitals Group     BP 01/28/23 0843 (!) 147/104     Pulse Rate 01/28/23 0843 66     Resp 01/28/23 0843 17     Temp 01/28/23 0843 98.2 F (36.8 C)     Temp Source 01/28/23 0843 Oral     SpO2 01/28/23 0843 97 %     Weight 01/28/23 0842 300 lb (136.1 kg)     Height --      Head Circumference --      Peak Flow --  Pain Score 01/28/23 0842 3     Pain Loc --      Pain Edu? --      Excl. in GC? --    No data found.  Updated Vital Signs BP (!) 147/104 (BP Location: Left Arm)   Pulse 66   Temp 98.2 F (36.8 C) (Oral)   Resp 17   Wt 300 lb (136.1 kg)   SpO2 97%   BMI 40.69 kg/m   Visual Acuity Right Eye Distance:   Left Eye Distance:   Bilateral Distance:    Right Eye Near:   Left Eye Near:    Bilateral Near:     Physical Exam Constitutional:      Appearance: Normal appearance. He is obese.  HENT:     Head: Normocephalic and atraumatic.     Right Ear: Tympanic membrane and ear canal normal.     Left Ear: Tympanic membrane and ear canal normal.     Nose: Congestion present.     Mouth/Throat:     Mouth: Mucous membranes are moist.  Eyes:      Pupils: Pupils are equal, round, and reactive to light.  Cardiovascular:     Rate and Rhythm: Normal rate and regular rhythm.  Pulmonary:     Effort: Pulmonary effort is normal.     Breath sounds: Wheezing present.  Abdominal:     General: Bowel sounds are normal.     Palpations: Abdomen is soft.  Neurological:     Mental Status: He is alert.      UC Treatments / Results  Labs (all labs ordered are listed, but only abnormal results are displayed) Labs Reviewed - No data to display  EKG   Radiology No results found.  Procedures Procedures (including critical care time)  Medications Ordered in UC Medications - No data to display  Initial Impression / Assessment and Plan / UC Course  I have reviewed the triage vital signs and the nursing notes.  Pertinent labs & imaging results that were available during my care of the patient were reviewed by me and considered in my medical decision making (see chart for details).    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  He is noticed with the upper respiratory infection, viral.  He is prescribed Atrovent 0.06% nasal spray to place 2 sprays into both nostrils 4 times daily, to take Tessalon Perles 200 mg by mouth 3 times a day as needed for symptoms and Promethazine DM 5 mL 4 times daily for cough.  Patient instructed to report new or worsening symptoms to his primary care provider or local emergency room.  He verbalized understanding left the facility in stable condition.  Final Clinical Impressions(s) / UC Diagnoses   Final diagnoses:  Upper respiratory infection, viral     Discharge Instructions      1) Place 2 sprays into both nostrils 4 times daily for your symptoms 2) take Tessalon Perles 100 mg 1 tablet by mouth 3 times a day as needed for cough. 3) take 5 mg by mouth 4 times daily as needed for up to 7  days for cough 4) report new or worsening symptoms to local emergency room or your family doctor.    ED Prescriptions     Medication Sig Dispense Auth. Provider   ipratropium (ATROVENT) 0.06 % nasal spray Place 2 sprays into both  nostrils 4 (four) times daily. 15 mL Eleonore Chiquito, FNP   benzonatate (TESSALON PERLES) 100 MG capsule Take 2 capsules (200 mg total) by mouth 3 (three) times daily as needed for up to 7 days for cough. 20 capsule Eleonore Chiquito, FNP   promethazine-dextromethorphan (PROMETHAZINE-DM) 6.25-15 MG/5ML syrup Take 5 mLs by mouth 4 (four) times daily as needed for up to 7 days for cough. 118 mL Eleonore Chiquito, FNP      PDMP not reviewed this encounter.   Eleonore Chiquito, FNP 01/28/23 [phone]
